# Patient Record
Sex: Male | Born: 2000 | Race: Black or African American | Hispanic: No | Marital: Single | Smoking: Current every day smoker
Health system: Southern US, Community
[De-identification: ages and names within clinical notes are randomized; demographics above are authoritative.]

## PROBLEM LIST (undated history)

## (undated) HISTORY — PX: KNEE ARTHROSCOPY: SHX127

## (undated) HISTORY — PX: KNEE SURGERY: SHX244

---
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
## 2019-12-11 ENCOUNTER — Encounter (HOSPITAL_BASED_OUTPATIENT_CLINIC_OR_DEPARTMENT_OTHER): Payer: Self-pay

## 2019-12-11 ENCOUNTER — Emergency Department (HOSPITAL_BASED_OUTPATIENT_CLINIC_OR_DEPARTMENT_OTHER)
Admission: EM | Admit: 2019-12-11 | Discharge: 2019-12-11 | Disposition: A | Discharge status: Home / Self Care | Payer: Medicaid Other | Attending: Emergency Medicine | Admitting: Emergency Medicine

## 2019-12-11 ENCOUNTER — Emergency Department (HOSPITAL_BASED_OUTPATIENT_CLINIC_OR_DEPARTMENT_OTHER): Payer: Medicaid Other

## 2019-12-11 ENCOUNTER — Other Ambulatory Visit: Payer: Self-pay

## 2019-12-11 DIAGNOSIS — F17210 Nicotine dependence, cigarettes, uncomplicated: Secondary | ICD-10-CM | POA: Insufficient documentation

## 2019-12-11 DIAGNOSIS — Z79899 Other long term (current) drug therapy: Secondary | ICD-10-CM | POA: Insufficient documentation

## 2019-12-11 DIAGNOSIS — R0789 Other chest pain: Secondary | ICD-10-CM

## 2019-12-11 MED ORDER — KETOROLAC TROMETHAMINE 15 MG/ML IJ SOLN
15.0000 mg | Freq: Once | INTRAMUSCULAR | Status: AC
  Administered 2019-12-11: 15 mg via INTRAVENOUS
  Filled 2019-12-11: qty 1

## 2019-12-11 NOTE — Discharge Instructions (Addendum)
Take 800 mg of Motrin every 8 hours for the next 5 days and then as needed.  Take 1000 mg of Tylenol 4 times a day for the next 5 days and then as needed.

## 2019-12-11 NOTE — ED Triage Notes (Addendum)
Pt c/o central CP x today-NAD-steady gait-pt also c/o rash "to my inner thigh" x 2 weeks

## 2019-12-11 NOTE — ED Provider Notes (Signed)
[facility] EMERGENCY DEPARTMENT Provider Note   CSN: [id number] Arrival date & time: 12/11/19  1946     History Chief Complaint  Patient presents with  . Chest Pain    Ernest Lam is a 18 y.o. male.  The history is provided by the patient.  Chest Pain Pain location:  L chest Pain quality: aching   Pain radiates to:  Does not radiate Pain severity:  Mild Onset quality:  Gradual Timing:  Intermittent Progression:  Waxing and waning Context: breathing, movement and raising an arm   Relieved by:  Nothing Worsened by:  Movement Associated symptoms: no abdominal pain, no back pain, no cough, no fever, no palpitations, no shortness of breath and no vomiting   Risk factors: no coronary artery disease, no diabetes mellitus, no high cholesterol, no hypertension and no prior DVT/PE        History reviewed. No pertinent past medical history.  There are no problems to display for this patient.   Past Surgical History:  Procedure Laterality Date  . KNEE ARTHROSCOPY    . KNEE SURGERY     removed a benign tumor/ 2014       No family history on file.  Social History   Tobacco Use  . Smoking status: Current Every Day Smoker    Types: Cigarettes  . Smokeless tobacco: Never Used  Substance Use Topics  . Alcohol use: No  . Drug use: Yes    Types: Marijuana    Home Medications Prior to Admission medications   Medication Sig Start Date End Date Taking? Authorizing Provider  famotidine (PEPCID) 20 MG tablet Take 1 tablet (20 mg total) by mouth 2 (two) times daily. 10/05/16   Nelva NayBeaton, Robert, MD  ondansetron (ZOFRAN ODT) 4 MG disintegrating tablet Take 1 tablet (4 mg total) by mouth every 8 (eight) hours as needed for nausea or vomiting. 07/20/17   McDonald, Mia A, PA-C    Allergies    Patient has no known allergies.  Review of Systems   Review of Systems  Constitutional: Negative for chills and fever.  HENT: Negative for ear pain and sore throat.   Eyes:  Negative for pain and visual disturbance.  Respiratory: Negative for cough, chest tightness and shortness of breath.   Cardiovascular: Positive for chest pain. Negative for palpitations.  Gastrointestinal: Negative for abdominal pain and vomiting.  Genitourinary: Negative for dysuria and hematuria.  Musculoskeletal: Negative for arthralgias and back pain.  Skin: Negative for color change and rash.  Neurological: Negative for seizures and syncope.  All other systems reviewed and are negative.   Physical Exam Updated Vital Signs BP 130/63 (BP Location: Left Arm)   Pulse (!) 54   Temp 98.6 F (37 C) (Oral)   Resp 18   Ht 6\' 1"  (1.854 m)   Wt 117 kg   SpO2 95%   BMI 34.04 kg/m   Physical Exam Vitals and nursing note reviewed.  Constitutional:      General: He is not in acute distress.    Appearance: He is well-developed. He is not ill-appearing.  HENT:     Head: Normocephalic and atraumatic.  Eyes:     Extraocular Movements: Extraocular movements intact.     Conjunctiva/sclera: Conjunctivae normal.     Pupils: Pupils are equal, round, and reactive to light.  Cardiovascular:     Rate and Rhythm: Normal rate and regular rhythm.     Pulses:          Radial pulses  are 2+ on the right side and 2+ on the left side.     Heart sounds: Normal heart sounds. No murmur.  Pulmonary:     Effort: Pulmonary effort is normal. No respiratory distress.     Breath sounds: Normal breath sounds. No decreased breath sounds or wheezing.  Abdominal:     Palpations: Abdomen is soft.     Tenderness: There is no abdominal tenderness.  Musculoskeletal:     Cervical back: Normal range of motion and neck supple.  Skin:    General: Skin is warm and dry.     Capillary Refill: Capillary refill takes less than 2 seconds.     Findings: Rash (dry skin to right inner thigh) present.  Neurological:     General: No focal deficit present.     Mental Status: He is alert.  Psychiatric:        Mood and  Affect: Mood normal.     ED Results / Procedures / Treatments   Labs (all labs ordered are listed, but only abnormal results are displayed) Labs Reviewed - No data to display  EKG EKG Interpretation  Date/Time:  Wednesday December 11 2019 19:53:39 EST Ventricular Rate:  51 PR Interval:  166 QRS Duration: 100 QT Interval:  430 QTC Calculation: 396 R Axis:   84 Text Interpretation: Sinus bradycardia Early repolarization Otherwise normal ECG No significant change since last tracing Confirmed by Lennice Sites [phone]) on 12/11/2019 8:00:26 PM   Radiology DG Chest 2 View  Result Date: 12/11/2019 CLINICAL DATA:  Chest pain EXAM: CHEST - 2 VIEW COMPARISON:  Radiograph 10/05/2016 FINDINGS: No consolidation, features of edema, pneumothorax, or effusion. Pulmonary vascularity is normally distributed. The cardiomediastinal contours are unremarkable. No acute osseous or soft tissue abnormality. IMPRESSION: No acute cardiopulmonary abnormality. Electronically Signed   By: Lovena Le M.D.   On: 12/11/2019 20:42    Procedures Procedures (including critical care time)  Medications Ordered in ED Medications  ketorolac (TORADOL) 15 MG/ML injection 15 mg (15 mg Intravenous Given 12/11/19 2019)    ED Course  I have reviewed the triage vital signs and the nursing notes.  Pertinent labs & imaging results that were available during my care of the patient were reviewed by me and considered in my medical decision making (see chart for details).    MDM Rules/Calculators/A&P                      Ernest Lam is a 18 year old male with no significant medical history presents to the ED with left-sided chest pain.  Patient with normal vitals.  No fever.  Also has a rash to his right inner thigh for the last several weeks that looks contact related rash.  No need for any active treatment.  Chest pain appears reproducible.  Patient has sinus bradycardia with early repole.  No infectious symptoms.   Noncardiac sounding chest pain.  Patient does repetitive lifting at work.  Overall likely musculoskeletal type chest pain.  No cardiac risk factors.  PERC negative and doubt PE.  Chest x-ray showed no signs of pneumonia, no pneumothorax, no pleural effusion.  Patient given Toradol shot.  Recommend continued use of Tylenol, Motrin for muscle related pain.  Discharged from ED in good condition.  Given return precautions.  This chart was dictated using voice recognition software.  Despite best efforts to proofread,  errors can occur which can change the documentation meaning.   Final Clinical Impression(s) / ED Diagnoses Final diagnoses:  Chest wall pain    Rx / DC Orders ED Discharge Orders    None       Virgina Norfolk, DO 12/11/19 2054

## 2020-02-25 ENCOUNTER — Emergency Department (HOSPITAL_BASED_OUTPATIENT_CLINIC_OR_DEPARTMENT_OTHER)
Admission: EM | Admit: 2020-02-25 | Discharge: 2020-02-25 | Disposition: A | Discharge status: Home / Self Care | Payer: Medicaid Other | Attending: Emergency Medicine | Admitting: Emergency Medicine

## 2020-02-25 ENCOUNTER — Other Ambulatory Visit: Payer: Self-pay

## 2020-02-25 ENCOUNTER — Encounter (HOSPITAL_BASED_OUTPATIENT_CLINIC_OR_DEPARTMENT_OTHER): Payer: Self-pay | Admitting: *Deleted

## 2020-02-25 DIAGNOSIS — F17210 Nicotine dependence, cigarettes, uncomplicated: Secondary | ICD-10-CM | POA: Insufficient documentation

## 2020-02-25 DIAGNOSIS — Z202 Contact with and (suspected) exposure to infections with a predominantly sexual mode of transmission: Secondary | ICD-10-CM | POA: Insufficient documentation

## 2020-02-25 DIAGNOSIS — R300 Dysuria: Secondary | ICD-10-CM | POA: Insufficient documentation

## 2020-02-25 LAB — HIV ANTIBODY (ROUTINE TESTING W REFLEX): HIV Screen 4th Generation wRfx: NONREACTIVE

## 2020-02-25 LAB — URINALYSIS, ROUTINE W REFLEX MICROSCOPIC
Bilirubin Urine: NEGATIVE
Glucose, UA: NEGATIVE mg/dL
Hgb urine dipstick: NEGATIVE
Ketones, ur: NEGATIVE mg/dL
Leukocytes,Ua: NEGATIVE
Nitrite: NEGATIVE
Protein, ur: NEGATIVE mg/dL
Specific Gravity, Urine: >1.03 — ABNORMAL HIGH (ref 1.005–1.030)
pH: 6 (ref 5.0–8.0)

## 2020-02-25 LAB — RPR: RPR Ser Ql: NONREACTIVE

## 2020-02-25 MED ORDER — AZITHROMYCIN 250 MG PO TABS
1000.0000 mg | ORAL_TABLET | Freq: Once | ORAL | Status: AC
  Administered 2020-02-25: 1000 mg via ORAL
  Filled 2020-02-25: qty 4

## 2020-02-25 MED ORDER — CEFTRIAXONE SODIUM 500 MG IJ SOLR
500.0000 mg | Freq: Once | INTRAMUSCULAR | Status: AC
  Administered 2020-02-25: 500 mg via INTRAMUSCULAR
  Filled 2020-02-25: qty 500

## 2020-02-25 NOTE — ED Provider Notes (Signed)
**Note Ernest Lam** [facility] EMERGENCY DEPARTMENT Provider Note   CSN: [id number] Arrival date & time: 02/25/20  0010     History Chief Complaint  Patient presents with  . SEXUALLY TRANSMITTED DISEASE    Ernest Lam is a 19 y.o. male.  The history is provided by the patient.  patient presents with STD testing.  He reports he was recently exposed to chlamydia.  He does not always uses condoms.  He has one sexual partner.  He reports mild dysuria, but no other acute complaints.  His course is stable.        PMH-none Past Surgical History:  Procedure Laterality Date  . KNEE ARTHROSCOPY    . KNEE SURGERY     removed a benign tumor/ 2014       No family history on file.  Social History   Tobacco Use  . Smoking status: Current Every Day Smoker    Types: Cigarettes  . Smokeless tobacco: Never Used  Substance Use Topics  . Alcohol use: No  . Drug use: Yes    Types: Marijuana    Home Medications Prior to Admission medications   Medication Sig Start Date End Date Taking? Authorizing Provider  famotidine (PEPCID) 20 MG tablet Take 1 tablet (20 mg total) by mouth 2 (two) times daily. 10/05/16   Leonard Schwartz, MD  ondansetron (ZOFRAN ODT) 4 MG disintegrating tablet Take 1 tablet (4 mg total) by mouth every 8 (eight) hours as needed for nausea or vomiting. 07/20/17   McDonald, Mia A, PA-C    Allergies    Patient has no known allergies.  Review of Systems   Review of Systems  Constitutional: Negative for fever.  Genitourinary: Positive for dysuria. Negative for discharge and genital sores.    Physical Exam Updated Vital Signs BP 135/81   Pulse 62   Temp 98.1 F (36.7 C) (Oral)   Resp 19   Ht 1.905 m (6\' 3" )   Wt 118.8 kg   SpO2 99%   BMI 32.75 kg/m   Physical Exam CONSTITUTIONAL: Well developed/well nourished HEAD: Normocephalic/atraumatic EYES: EOMI ENMT: Mucous membranes moist NECK: supple no meningeal signs LUNGS:  no apparent distress ABDOMEN: soft    NEURO: Pt is awake/alert/appropriate, moves all extremitiesx4.  No facial droop.   EXTREMITIES:  full ROM SKIN: warm, color normal PSYCH: no abnormalities of mood noted, alert and oriented to situation  ED Results / Procedures / Treatments   Labs (all labs ordered are listed, but only abnormal results are displayed) Labs Reviewed  URINALYSIS, ROUTINE W REFLEX MICROSCOPIC - Abnormal; Notable for the following components:      Result Value   Specific Gravity, Urine >1.030 (*)    All other components within normal limits  RPR  HIV ANTIBODY (ROUTINE TESTING W REFLEX)  GC/CHLAMYDIA PROBE AMP ([place]) NOT AT [facility]    EKG None  Radiology No results found.  Procedures Procedures   Medications Ordered in ED Medications  cefTRIAXone (ROCEPHIN) injection 500 mg (500 mg Intramuscular Given 02/25/20 0246)  azithromycin (ZITHROMAX) tablet 1,000 mg (1,000 mg Oral Given 02/25/20 0245)    ED Course  I have reviewed the triage vital signs and the nursing notes.  Pertinent labs   results that were available during my care of the patient were reviewed by me and considered in my medical decision making (see chart for details).    MDM Rules/Calculators/A&P  Plan to empirically treat for STDs.  We discussed safe sex precautions Final Clinical Impression(s) / ED Diagnoses Final diagnoses:  Exposure to chlamydia    Rx / DC Orders ED Discharge Orders    None       Zadie Rhine, MD 02/25/20 [phone]

## 2020-02-25 NOTE — ED Triage Notes (Signed)
Pt. Reports he was exposed and his exgirlfriend told him she had one yesterday.

## 2020-02-26 LAB — GC/CHLAMYDIA PROBE AMP (~~LOC~~) NOT AT ARMC
Chlamydia: NEGATIVE
Neisseria Gonorrhea: NEGATIVE

## 2020-05-11 ENCOUNTER — Encounter (HOSPITAL_BASED_OUTPATIENT_CLINIC_OR_DEPARTMENT_OTHER): Payer: Self-pay

## 2020-05-11 ENCOUNTER — Other Ambulatory Visit: Payer: Self-pay

## 2020-05-11 ENCOUNTER — Emergency Department (HOSPITAL_BASED_OUTPATIENT_CLINIC_OR_DEPARTMENT_OTHER)
Admission: EM | Admit: 2020-05-11 | Discharge: 2020-05-11 | Disposition: A | Discharge status: Home / Self Care | Payer: Medicaid Other | Attending: Emergency Medicine | Admitting: Emergency Medicine

## 2020-05-11 DIAGNOSIS — L03818 Cellulitis of other sites: Secondary | ICD-10-CM | POA: Insufficient documentation

## 2020-05-11 DIAGNOSIS — L03211 Cellulitis of face: Secondary | ICD-10-CM

## 2020-05-11 DIAGNOSIS — F17210 Nicotine dependence, cigarettes, uncomplicated: Secondary | ICD-10-CM | POA: Insufficient documentation

## 2020-05-11 MED ORDER — LIDOCAINE-EPINEPHRINE-TETRACAINE (LET) TOPICAL GEL
3.0000 mL | Freq: Once | TOPICAL | Status: AC
Start: 2020-05-11 — End: 2020-05-11
  Administered 2020-05-11: 3 mL via TOPICAL

## 2020-05-11 MED ORDER — LIDOCAINE-EPINEPHRINE-TETRACAINE (LET) TOPICAL GEL: 3.0000 mL | Freq: Once | TOPICAL | Status: DC

## 2020-05-11 MED ORDER — KETOROLAC TROMETHAMINE 15 MG/ML IJ SOLN
30.0000 mg | Freq: Once | INTRAMUSCULAR | Status: AC
  Administered 2020-05-11: 30 mg via INTRAMUSCULAR
  Filled 2020-05-11: qty 2

## 2020-05-11 MED ORDER — LIDOCAINE-PRILOCAINE 2.5-2.5 % EX CREA
TOPICAL_CREAM | Freq: Once | CUTANEOUS | Status: DC
  Filled 2020-05-11: qty 5

## 2020-05-11 MED ORDER — DOXYCYCLINE HYCLATE 100 MG PO CAPS: 100.0000 mg | ORAL_CAPSULE | Freq: Two times a day (BID) | ORAL | 0 refills | Status: AC

## 2020-05-11 MED ORDER — LIDOCAINE-EPINEPHRINE-TETRACAINE (LET) TOPICAL GEL
TOPICAL | Status: AC
  Filled 2020-05-11: qty 3

## 2020-05-11 NOTE — ED Triage Notes (Signed)
Pt arrives with swelling to left upper lip. Pt states he had a bump and attempted to pop it, states upon waking area was swollen.

## 2020-05-11 NOTE — ED Provider Notes (Signed)
[facility] EMERGENCY DEPARTMENT Provider Note   CSN: [id number] Arrival date & time: 05/11/20  1223     History Chief Complaint  Patient presents with  . Facial Swelling    Ernest Lam is a 19 y.o. male.  HPI 19 year old African-American male with no pertinent past medical history presents the ER for evaluation of left upper lip swelling.  Patient reports that yesterday he noticed a pimple to his left side of his face.  Patient attempted to pop it last night and got a small amount of drainage from the area.  Patient states he woke up this morning with significant swelling to his left face and upper lip.  Tender to palpation.  No further drainage noted.  Patient denies any fevers or chills.  No difficulties breathing or swallowing.  No new medications.  No history of MRSA infection.    History reviewed. No pertinent past medical history.  There are no problems to display for this patient.   Past Surgical History:  Procedure Laterality Date  . KNEE ARTHROSCOPY    . KNEE SURGERY     removed a benign tumor/ 2014       No family history on file.  Social History   Tobacco Use  . Smoking status: Current Every Day Smoker    Types: Cigarettes  . Smokeless tobacco: Never Used  Substance Use Topics  . Alcohol use: No  . Drug use: Yes    Types: Marijuana    Home Medications Prior to Admission medications   Medication Sig Start Date End Date Taking? Authorizing Provider  doxycycline (VIBRAMYCIN) 100 MG capsule Take 1 capsule (100 mg total) by mouth 2 (two) times daily. 05/11/20   Doristine Devoid, PA-C  famotidine (PEPCID) 20 MG tablet Take 1 tablet (20 mg total) by mouth 2 (two) times daily. 10/05/16   Leonard Schwartz, MD  ondansetron (ZOFRAN ODT) 4 MG disintegrating tablet Take 1 tablet (4 mg total) by mouth every 8 (eight) hours as needed for nausea or vomiting. 07/20/17   McDonald, Mia A, PA-C    Allergies    Patient has no known allergies.  Review of  Systems   Review of Systems  Constitutional: Negative for chills and fever.  HENT: Positive for facial swelling. Negative for congestion.   Eyes: Negative for discharge.  Respiratory: Negative for cough.   Gastrointestinal: Negative for vomiting.  Skin: Positive for wound.  Psychiatric/Behavioral: Negative for confusion.    Physical Exam Updated Vital Signs BP 138/86 (BP Location: Right Arm)   Pulse 62   Temp 98.7 F (37.1 C) (Oral)   Resp 18   Ht 6\' 3"  (1.905 m)   Wt 113.4 kg   SpO2 100%   BMI 31.25 kg/m   Physical Exam Vitals and nursing note reviewed.  Constitutional:      General: He is not in acute distress.    Appearance: He is well-developed.  HENT:     Head: Normocephalic and atraumatic.     Comments: Patient with pimple just above the upper lip with associated facial swelling.  No erythema.  No significant drainage from the site.  Tender to palpation.    Mouth/Throat:     Mouth: Mucous membranes are moist.     Pharynx: Oropharynx is clear.  Eyes:     General: No scleral icterus.       Right eye: No discharge.        Left eye: No discharge.  Pulmonary:     Effort:  No respiratory distress.  Musculoskeletal:        General: Normal range of motion.     Cervical back: Normal range of motion.  Skin:    Coloration: Skin is not pale.  Neurological:     Mental Status: He is alert.  Psychiatric:        Behavior: Behavior normal.        Thought Content: Thought content normal.        Judgment: Judgment normal.     ED Results / Procedures / Treatments   Labs (all labs ordered are listed, but only abnormal results are displayed) Labs Reviewed - No data to display  EKG None  Radiology No results found.  Procedures .Marland KitchenIncision and Drainage  Date/Time: 05/11/2020 2:40 PM Performed by: Rise Mu, PA-C Authorized by: Rise Mu, PA-C   Consent:    Consent obtained:  Verbal   Consent given by:  Patient   Risks discussed:  Bleeding,  damage to other organs, infection and incomplete drainage Location:    Type:  Abscess   Size:  2   Location:  Head   Head location:  Face Pre-procedure details:    Skin preparation:  Antiseptic wash Procedure details:    Incision types:  Stab incision   Scalpel size: 18 g needle.   Drainage:  Bloody and purulent   Drainage amount:  Scant   Wound treatment:  Wound left open   Packing materials:  None   (including critical care time)  Medications Ordered in ED Medications  lidocaine-EPINEPHrine-tetracaine (LET) topical gel (3 mLs Topical Given 05/11/20 1341)  ketorolac (TORADOL) 15 MG/ML injection 30 mg (30 mg Intramuscular Given 05/11/20 1433)    ED Course  I have reviewed the triage vital signs and the nursing notes.  Pertinent labs & imaging results that were available during my care of the patient were reviewed by me and considered in my medical decision making (see chart for details).    MDM Rules/Calculators/A&P                      Patient with skin abscess amenable to incision and drainage.  Abscess was not large enough to warrant packing or drain,  wound recheck in 2 days. Encouraged home warm soaks and flushing.  Mild signs of cellulitis is surrounding skin.  Will d/c to home.  No antibiotic therapy is indicated.   Final Clinical Impression(s) / ED Diagnoses Final diagnoses:  Cellulitis of face    Rx / DC Orders ED Discharge Orders         Ordered    doxycycline (VIBRAMYCIN) 100 MG capsule  2 times daily     05/11/20 1421           Wallace Keller 05/11/20 1442    Raeford Razor, MD 05/11/20 1447

## 2020-05-11 NOTE — Discharge Instructions (Addendum)
Take antibiotics for infection.  Warm and cold compress to the area.  Anti-inflammatory such as Motrin, Advil or Aleve for pain and swelling.  Please follow-up in 2 to 3 days if symptoms not improving with antibiotics return sooner if your symptoms worsen.

## 2020-05-11 NOTE — ED Notes (Signed)
Pimple to upper lip w increased swelling

## 2020-05-13 ENCOUNTER — Encounter (HOSPITAL_BASED_OUTPATIENT_CLINIC_OR_DEPARTMENT_OTHER): Payer: Self-pay | Admitting: *Deleted

## 2020-05-13 ENCOUNTER — Other Ambulatory Visit: Payer: Self-pay

## 2020-05-13 ENCOUNTER — Emergency Department (HOSPITAL_BASED_OUTPATIENT_CLINIC_OR_DEPARTMENT_OTHER)
Admission: EM | Admit: 2020-05-13 | Discharge: 2020-05-13 | Disposition: A | Discharge status: Home / Self Care | Payer: Medicaid Other | Attending: Emergency Medicine | Admitting: Emergency Medicine

## 2020-05-13 DIAGNOSIS — L0291 Cutaneous abscess, unspecified: Secondary | ICD-10-CM

## 2020-05-13 DIAGNOSIS — F17210 Nicotine dependence, cigarettes, uncomplicated: Secondary | ICD-10-CM | POA: Insufficient documentation

## 2020-05-13 DIAGNOSIS — L0201 Cutaneous abscess of face: Secondary | ICD-10-CM | POA: Insufficient documentation

## 2020-05-13 DIAGNOSIS — Z79899 Other long term (current) drug therapy: Secondary | ICD-10-CM | POA: Insufficient documentation

## 2020-05-13 MED ORDER — SULFAMETHOXAZOLE-TRIMETHOPRIM 800-160 MG PO TABS
1.0000 | ORAL_TABLET | Freq: Two times a day (BID) | ORAL | 0 refills | Status: DC
Start: 2020-05-13 — End: 2020-05-13

## 2020-05-13 MED ORDER — SULFAMETHOXAZOLE-TRIMETHOPRIM 800-160 MG PO TABS
2.0000 | ORAL_TABLET | Freq: Once | ORAL | Status: AC
  Administered 2020-05-13: 2 via ORAL
  Filled 2020-05-13: qty 2

## 2020-05-13 MED ORDER — SULFAMETHOXAZOLE-TRIMETHOPRIM 800-160 MG PO TABS: 1.0000 | ORAL_TABLET | Freq: Two times a day (BID) | ORAL | 0 refills | Status: AC

## 2020-05-13 NOTE — ED Provider Notes (Signed)
[facility] EMERGENCY DEPARTMENT Provider Note   CSN: [id number] Arrival date & time: 05/13/20  0009     History Chief Complaint  Patient presents with  . Abscess    Ernest Lam is a 19 y.o. male.  Patient presents to the emergency department for evaluation of pain and swelling of the left.  He was seen in the ED yesterday, did not fill prescription for antibiotic.  Patient reports that he has been squeezing the area and has gotten some thick pus out.  Lip is more swollen today than it has been.        History reviewed. No pertinent past medical history.  There are no problems to display for this patient.   Past Surgical History:  Procedure Laterality Date  . KNEE ARTHROSCOPY    . KNEE SURGERY     removed a benign tumor/ 2014       History reviewed. No pertinent family history.  Social History   Tobacco Use  . Smoking status: Current Every Day Smoker    Types: Cigarettes  . Smokeless tobacco: Never Used  Substance Use Topics  . Alcohol use: No  . Drug use: Yes    Types: Marijuana    Home Medications Prior to Admission medications   Medication Sig Start Date End Date Taking? Authorizing Provider  doxycycline (VIBRAMYCIN) 100 MG capsule Take 1 capsule (100 mg total) by mouth 2 (two) times daily. 05/11/20   Rise Mu, PA-C  famotidine (PEPCID) 20 MG tablet Take 1 tablet (20 mg total) by mouth 2 (two) times daily. 10/05/16   Nelva Nay, MD  ondansetron (ZOFRAN ODT) 4 MG disintegrating tablet Take 1 tablet (4 mg total) by mouth every 8 (eight) hours as needed for nausea or vomiting. 07/20/17   McDonald, Mia A, PA-C  sulfamethoxazole-trimethoprim (BACTRIM DS) 800-160 MG tablet Take 1 tablet by mouth 2 (two) times daily. 05/13/20   Gilda Crease, MD    Allergies    Patient has no known allergies.  Review of Systems   Review of Systems  Constitutional: Negative for fever.  Skin: Positive for wound.    Physical Exam Updated  Vital Signs BP 130/71 (BP Location: Left Arm)   Pulse 70   Temp 98.7 F (37.1 C) (Oral)   Resp 16   Ht 6\' 3"  (1.905 m)   Wt 113.4 kg   SpO2 97%   BMI 31.25 kg/m   Physical Exam Vitals and nursing note reviewed.  Constitutional:      General: He is not in acute distress.    Appearance: Normal appearance. He is well-developed.  HENT:     Head: Normocephalic and atraumatic.     Comments: Ulcerated area with surrounding induration left side of upper lip.  No fluctuance.    Right Ear: Hearing normal.     Left Ear: Hearing normal.     Nose: Nose normal.  Eyes:     Conjunctiva/sclera: Conjunctivae normal.     Pupils: Pupils are equal, round, and reactive to light.  Cardiovascular:     Rate and Rhythm: Regular rhythm.     Heart sounds: S1 normal and S2 normal. No murmur. No friction rub. No gallop.   Pulmonary:     Effort: Pulmonary effort is normal. No respiratory distress.     Breath sounds: Normal breath sounds.  Chest:     Chest wall: No tenderness.  Abdominal:     General: Bowel sounds are normal.     Palpations: Abdomen  is soft.     Tenderness: There is no abdominal tenderness. There is no guarding or rebound. Negative signs include Murphy's sign and McBurney's sign.     Hernia: No hernia is present.  Musculoskeletal:        General: Normal range of motion.     Cervical back: Normal range of motion and neck supple.  Skin:    General: Skin is warm and dry.     Findings: No rash.  Neurological:     Mental Status: He is alert and oriented to person, place, and time.     GCS: GCS eye subscore is 4. GCS verbal subscore is 5. GCS motor subscore is 6.     Cranial Nerves: No cranial nerve deficit.     Sensory: No sensory deficit.     Coordination: Coordination normal.  Psychiatric:        Speech: Speech normal.        Behavior: Behavior normal.        Thought Content: Thought content normal.     ED Results / Procedures / Treatments   Labs (all labs ordered are  listed, but only abnormal results are displayed) Labs Reviewed - No data to display  EKG None  Radiology No results found.  Procedures Procedures (including critical care time)  Medications Ordered in ED Medications  sulfamethoxazole-trimethoprim (BACTRIM DS) 800-160 MG per tablet 2 tablet (has no administration in time range)    ED Course  I have reviewed the triage vital signs and the nursing notes.  Pertinent labs & imaging results that were available during my care of the patient were reviewed by me and considered in my medical decision making (see chart for details).    MDM Rules/Calculators/A&P                      Patient with continued lesion on the lip.  He has not started the prescribed antibiotics.  I do not feel any fluctuance in the area.  There is an open ulcerated area in the center of the swollen portion from him squeezing on it.  There is some necrotic tissue within the ulcerated area but no pus expresses.  Try Bactrim as this is most likely staph and will be cheaper than the doxycycline.  Final Clinical Impression(s) / ED Diagnoses Final diagnoses:  Abscess    Rx / DC Orders ED Discharge Orders         Ordered    sulfamethoxazole-trimethoprim (BACTRIM DS) 800-160 MG tablet  2 times daily     05/13/20 0208           Orpah Greek, MD 05/13/20 [phone]

## 2020-05-13 NOTE — ED Triage Notes (Addendum)
Abscess to face x 4 days , seen here yesterday for same, pt did not start doxy, pt requesting work note

## 2020-10-31 ENCOUNTER — Emergency Department (HOSPITAL_BASED_OUTPATIENT_CLINIC_OR_DEPARTMENT_OTHER)
Admission: EM | Admit: 2020-10-31 | Discharge: 2020-10-31 | Disposition: A | Discharge status: Home / Self Care | Payer: Medicaid Other | Attending: Emergency Medicine | Admitting: Emergency Medicine

## 2020-10-31 ENCOUNTER — Encounter (HOSPITAL_BASED_OUTPATIENT_CLINIC_OR_DEPARTMENT_OTHER): Payer: Self-pay

## 2020-10-31 ENCOUNTER — Other Ambulatory Visit: Payer: Self-pay

## 2020-10-31 DIAGNOSIS — Z202 Contact with and (suspected) exposure to infections with a predominantly sexual mode of transmission: Secondary | ICD-10-CM | POA: Insufficient documentation

## 2020-10-31 DIAGNOSIS — B029 Zoster without complications: Secondary | ICD-10-CM

## 2020-10-31 DIAGNOSIS — F17210 Nicotine dependence, cigarettes, uncomplicated: Secondary | ICD-10-CM | POA: Insufficient documentation

## 2020-10-31 DIAGNOSIS — F17290 Nicotine dependence, other tobacco product, uncomplicated: Secondary | ICD-10-CM | POA: Insufficient documentation

## 2020-10-31 MED ORDER — VALACYCLOVIR HCL 1 G PO TABS: 1000.0000 mg | ORAL_TABLET | Freq: Three times a day (TID) | ORAL | 0 refills | Status: AC

## 2020-10-31 NOTE — ED Provider Notes (Signed)
[facility] EMERGENCY DEPARTMENT Provider Note   CSN: [id number] Arrival date & time: 10/31/20  1258     History Chief Complaint  Patient presents with  . Exposure to STD    Ernest Lam is a 19 y.o. male.  19 year old male presents with complaint of rash to his face with request for STD testing.  Patient states that he had his haircut 1 week ago, the next day he noticed itching on the right side of his scalp with lesions breaking out on his forehead and down the right side of his nose on Tuesday (4 days ago).  Patient dates the area feels irritated, he has been applying Vaseline to the area.  Denies eye pain or visual disturbance.  Also states he was told by male partner that she recently tested positive for chlamydia, states that he had intercourse with this person did not use a condom.  Patient denies any urethral discharge or other concerns.        History reviewed. No pertinent past medical history.  There are no problems to display for this patient.   Past Surgical History:  Procedure Laterality Date  . KNEE ARTHROSCOPY    . KNEE SURGERY     removed a benign tumor/ 2014       No family history on file.  Social History   Tobacco Use  . Smoking status: Current Every Day Smoker    Types: Cigarettes, Cigars  . Smokeless tobacco: Never Used  Vaping Use  . Vaping Use: Every day  Substance Use Topics  . Alcohol use: No  . Drug use: Yes    Types: Marijuana    Home Medications Prior to Admission medications   Medication Sig Start Date End Date Taking? Authorizing Provider  doxycycline (VIBRAMYCIN) 100 MG capsule Take 1 capsule (100 mg total) by mouth 2 (two) times daily. 05/11/20   Rise Mu, PA-C  famotidine (PEPCID) 20 MG tablet Take 1 tablet (20 mg total) by mouth 2 (two) times daily. 10/05/16   Nelva Nay, MD  ondansetron (ZOFRAN ODT) 4 MG disintegrating tablet Take 1 tablet (4 mg total) by mouth every 8 (eight) hours as needed for  nausea or vomiting. 07/20/17   McDonald, Mia A, PA-C  sulfamethoxazole-trimethoprim (BACTRIM DS) 800-160 MG tablet Take 1 tablet by mouth 2 (two) times daily. 05/13/20   Gilda Crease, MD  valACYclovir (VALTREX) 1000 MG tablet Take 1 tablet (1,000 mg total) by mouth 3 (three) times daily. 10/31/20   Jeannie Fend, PA-C    Allergies    Patient has no known allergies.  Review of Systems   Review of Systems  Constitutional: Negative for fever.  Eyes: Negative for pain and visual disturbance.  Genitourinary: Negative for discharge, dysuria, genital sores, penile pain, penile swelling, scrotal swelling and testicular pain.  Musculoskeletal: Negative for arthralgias and myalgias.  Skin: Positive for rash. Negative for wound.  Allergic/Immunologic: Negative for immunocompromised state.  Hematological: Negative for adenopathy.  All other systems reviewed and are negative.   Physical Exam Updated Vital Signs BP 134/84 (BP Location: Left Arm)   Pulse 77   Temp 98.5 F (36.9 C) (Oral)   Resp 18   Ht 6\' 1"  (1.854 m)   Wt 113.4 kg   SpO2 99%   BMI 32.98 kg/m   Physical Exam Vitals and nursing note reviewed. Exam conducted with a chaperone present.  Constitutional:      General: He is not in acute distress.  Appearance: He is well-developed. He is not diaphoretic.  HENT:     Head: Normocephalic and atraumatic.  Pulmonary:     Effort: Pulmonary effort is normal.  Genitourinary:    Penis: Circumcised. No tenderness or discharge.   Skin:    General: Skin is warm and dry.     Comments: Patient's to right side of scalp near midline, extends down the line forehead to right side of nose.  No lesions noted to tip of nose. No lesions noted around the eye.  Neurological:     Mental Status: He is alert and oriented to person, place, and time.  Psychiatric:        Behavior: Behavior normal.     ED Results / Procedures / Treatments   Labs (all labs ordered are listed, but only  abnormal results are displayed) Labs Reviewed  VIRUS CULTURE  GC/CHLAMYDIA PROBE AMP ([place]) NOT AT [facility]    EKG None  Radiology No results found.  Procedures Procedures (including critical care time)  Medications Ordered in ED Medications - No data to display  ED Course  I have reviewed the triage vital signs and the nursing notes.  Pertinent labs & imaging results that were available during my care of the patient were reviewed by me and considered in my medical decision making (see chart for details).  Clinical Course as of Oct 31 1529  Sat Oct 31, 2020  [id number] 19 year old male with lesions to face x4 days.  Also with request for STD test. On exam, found to have lesions to right side of scalp extending on right side of nose, question presence of vesicles however difficult to assess as patient has been applying Vaseline to lesions. Lesions were swabbed with concern for shingles, advised to start Valtrex today.  Recheck for worsening or concerning symptoms. Patient to follow-up on his gonorrhea and chlamydia test and return to health department for any necessary treatment.  Also recommend follow-up with health department should he desire to be tested for syphilis or HIV.   [LM]    Clinical Course User Index [LM] Alden Hipp   MDM Rules/Calculators/A&P                          Final Clinical Impression(s) / ED Diagnoses Final diagnoses:  STD exposure  Herpes zoster without complication    Rx / DC Orders ED Discharge Orders         Ordered    valACYclovir (VALTREX) 1000 MG tablet  3 times daily        10/31/20 1407           Alden Hipp 10/31/20 1530    Jacalyn Lefevre, MD 11/01/20 [phone]

## 2020-10-31 NOTE — ED Triage Notes (Signed)
Pt. Reports being exposed to someone who tested positive for chlamydia. Pt also c/o bumps on face and scalp.

## 2020-10-31 NOTE — Discharge Instructions (Addendum)
Call in 3 days to follow-up with your test results or check your MyChart account. Valtrex prescribed today is for shingles on your face.  It is important to start this medication today and complete the full course.  Recheck for worsening or concerning symptoms.  If any of your STD tests are positive, you may go to the health department for free treatment.

## 2020-11-02 LAB — GC/CHLAMYDIA PROBE AMP (~~LOC~~) NOT AT ARMC
Chlamydia: NEGATIVE
Comment: NEGATIVE
Comment: NORMAL
Neisseria Gonorrhea: NEGATIVE

## 2020-11-10 LAB — VIRUS CULTURE

## 2021-04-12 ENCOUNTER — Emergency Department (HOSPITAL_BASED_OUTPATIENT_CLINIC_OR_DEPARTMENT_OTHER)
Admission: EM | Admit: 2021-04-12 | Discharge: 2021-04-12 | Disposition: A | Discharge status: Home / Self Care | Payer: Medicaid Other | Attending: Emergency Medicine | Admitting: Emergency Medicine

## 2021-04-12 ENCOUNTER — Other Ambulatory Visit: Payer: Self-pay

## 2021-04-12 ENCOUNTER — Encounter (HOSPITAL_BASED_OUTPATIENT_CLINIC_OR_DEPARTMENT_OTHER): Payer: Self-pay

## 2021-04-12 DIAGNOSIS — B349 Viral infection, unspecified: Secondary | ICD-10-CM | POA: Insufficient documentation

## 2021-04-12 DIAGNOSIS — F17290 Nicotine dependence, other tobacco product, uncomplicated: Secondary | ICD-10-CM | POA: Insufficient documentation

## 2021-04-12 DIAGNOSIS — U071 COVID-19: Secondary | ICD-10-CM | POA: Insufficient documentation

## 2021-04-12 LAB — RESP PANEL BY RT-PCR (FLU A&B, COVID) ARPGX2
Influenza A by PCR: NEGATIVE
Influenza B by PCR: NEGATIVE
SARS Coronavirus 2 by RT PCR: POSITIVE — AB

## 2021-04-12 MED ORDER — IBUPROFEN 400 MG PO TABS
600.0000 mg | ORAL_TABLET | Freq: Once | ORAL | Status: AC
  Administered 2021-04-12: 600 mg via ORAL
  Filled 2021-04-12: qty 1

## 2021-04-12 NOTE — ED Provider Notes (Signed)
[facility] EMERGENCY DEPARTMENT Provider Note   CSN: [id number] Arrival date & time: 04/12/21  1653     History Chief Complaint  Patient presents with  . Generalized Body Aches    Ernest Lam is a 20 y.o. male with no relevant past medical history presents the ED with complaints of headache and generalized body aches.  On my examination, patient states that last night he developed chills, fevers, headache, neck and back discomfort, and generalized muscle pains.  He also states that he has mild congestion and mild cough.  He lives at home with his mother who has been asymptomatic.   He states that he went to work today, but given his body aches and overall feeling poorly, ultimately left and came here to the ED for evaluation.  He had taken some Equate pain pills earlier, which he suspects was acetaminophen.  He denies any ability to turn his head/severe neck stiffness, history of IVDA or malignancy, chronic steroids, numbness or extremity weakness, blurred vision, abdominal pain, hemoptysis, nausea or vomiting, urinary symptoms, or changes in his bowel habits.  He called his mom and they state that he received all of his childhood immunizations, suspect that included his meningococcal immunization.  He states that his sister had meningitis when she was an infant so it is not something that they would have declined.    HPI     History reviewed. No pertinent past medical history.  There are no problems to display for this patient.   Past Surgical History:  Procedure Laterality Date  . KNEE ARTHROSCOPY    . KNEE SURGERY     removed a benign tumor/ 2014       No family history on file.  Social History   Tobacco Use  . Smoking status: Current Every Day Smoker    Types: E-cigarettes  . Smokeless tobacco: Never Used  Vaping Use  . Vaping Use: Every day  Substance Use Topics  . Alcohol use: No  . Drug use: Not Currently    Types: Marijuana    Home  Medications Prior to Admission medications   Medication Sig Start Date End Date Taking? Authorizing Provider  doxycycline (VIBRAMYCIN) 100 MG capsule Take 1 capsule (100 mg total) by mouth 2 (two) times daily. 05/11/20   Rise Mu, PA-C  famotidine (PEPCID) 20 MG tablet Take 1 tablet (20 mg total) by mouth 2 (two) times daily. 10/05/16   Nelva Nay, MD  ondansetron (ZOFRAN ODT) 4 MG disintegrating tablet Take 1 tablet (4 mg total) by mouth every 8 (eight) hours as needed for nausea or vomiting. 07/20/17   McDonald, Mia A, PA-C  sulfamethoxazole-trimethoprim (BACTRIM DS) 800-160 MG tablet Take 1 tablet by mouth 2 (two) times daily. 05/13/20   Gilda Crease, MD  valACYclovir (VALTREX) 1000 MG tablet Take 1 tablet (1,000 mg total) by mouth 3 (three) times daily. 10/31/20   Jeannie Fend, PA-C    Allergies    Patient has no known allergies.  Review of Systems   Review of Systems  All other systems reviewed and are negative.   Physical Exam Updated Vital Signs BP 139/63 (BP Location: Left Arm)   Pulse 94   Temp 100.2 F (37.9 C) (Oral)   Resp 20   SpO2 100%   Physical Exam Vitals and nursing note reviewed. Exam conducted with a chaperone present.  Constitutional:      General: He is not in acute distress.    Appearance: He is  ill-appearing. He is not toxic-appearing.  HENT:     Head: Normocephalic and atraumatic.     Nose: Congestion present.     Mouth/Throat:     Pharynx: Oropharynx is clear.     Comments: No tonsillar hypertrophy, exudates, or uvular deviation. Eyes:     General: No scleral icterus.    Conjunctiva/sclera: Conjunctivae normal.  Neck:     Comments: ROM fully intact.  No meningismus.  No midline cervical tenderness to palpation.  TTP over trapezius bilaterally.  No overlying skin changes. Cardiovascular:     Rate and Rhythm: Normal rate and regular rhythm.     Pulses: Normal pulses.     Heart sounds: Normal heart sounds.  Pulmonary:      Effort: Pulmonary effort is normal. No respiratory distress.     Breath sounds: Normal breath sounds. No wheezing or rales.     Comments: No increased work of breathing.  CTA bilaterally. Abdominal:     General: Abdomen is flat. There is no distension.     Palpations: Abdomen is soft.     Tenderness: There is no abdominal tenderness.     Comments: Soft, nondistended.  No areas of tenderness.  Musculoskeletal:        General: Normal range of motion.     Cervical back: Normal range of motion. Tenderness present. No rigidity.  Skin:    General: Skin is dry.  Neurological:     General: No focal deficit present.     Mental Status: He is alert and oriented to person, place, and time.     GCS: GCS eye subscore is 4. GCS verbal subscore is 5. GCS motor subscore is 6.  Psychiatric:        Mood and Affect: Mood normal.        Behavior: Behavior normal.        Thought Content: Thought content normal.     ED Results / Procedures / Treatments   Labs (all labs ordered are listed, but only abnormal results are displayed) Labs Reviewed  RESP PANEL BY RT-PCR (FLU A&B, COVID) [id number]    EKG None  Radiology No results found.  Procedures Procedures   Medications Ordered in ED Medications  ibuprofen (ADVIL) tablet 600 mg (600 mg Oral Given 04/12/21 1858)    ED Course  I have reviewed the triage vital signs and the nursing notes.  Pertinent labs & imaging results that were available during my care of the patient were reviewed by me and considered in my medical decision making (see chart for details).    MDM Rules/Calculators/A&P                          Ernest Lam was evaluated in Emergency Department on 04/12/2021 for the symptoms described in the history of present illness. He was evaluated in the context of the global COVID-19 pandemic, which necessitated consideration that the patient might be at risk for infection with the SARS-CoV-2 virus that causes COVID-19. Institutional  protocols and algorithms that pertain to the evaluation of patients at risk for COVID-19 are in a state of rapid change based on information released by regulatory bodies including the CDC and federal and state organizations. These policies and algorithms were followed during the patient's care in the ED.  I personally reviewed patient's medical chart and all notes from triage and staff during today's encounter. I have also ordered and reviewed all labs and imaging that I felt to  be medically necessary in the evaluation of this patient's complaints and with consideration of their physical exam. If needed, translation services were available and utilized.   Patient with a 1 day history of cold symptoms and muscle aches, likely viral etiology.  Will obtain respiratory panel by PCR.  Will administer ibuprofen here in the ED for low-grade fever and headache symptoms.  While he denies any obvious sick contacts, his mild cough, congestion, generalized body aches, and low-grade fever/chills are suggestive of viral process.  There is no meningismus on my exam.  He believes that he received the meningitis immunization.  I have low suspicion for meningitis at this time.  He also adamantly denies any IVDA or other high-risk behavior otherwise concerning for osteomyelitis or epidural abscess.  There is no concerning midline spinal TTP.  Will encourage patient to maintain isolation precautions pending results of this testing.  Given brevity of illness and reassuring physical exam, do not feel as though further laboratory work-up or imaging is warranted.  Recommending over-the-counter management as needed for symptomatic relief.  He does not actively have a primary care provider, will refer to [facility].  ED return precautions discussed.  Patient voices understanding and is agreeable to the plan.  Final Clinical Impression(s) / ED Diagnoses Final diagnoses:  Viral illness    Rx /  DC Orders ED Discharge Orders    None       Lorelee New, PA-C 04/12/21 1945    Rozelle Logan, DO 04/14/21 [id number]

## 2021-04-12 NOTE — Discharge Instructions (Addendum)
You have been tested for COVID-19 and influenza.  Please maintain isolation precautions pending results of your testing.  I would like you to check your temperature regularly and take Tylenol or ibuprofen as needed for fever control.  They will also help with your generalized body aches.  I recommend ibuprofen 600 mg every 6 hours and Tylenol 500 mg every 6 hours as needed.  Please return to the ED or seek immediate medical attention should you experience any new or worsening symptoms.

## 2021-04-12 NOTE — ED Triage Notes (Signed)
Pt c/o HA, neck pain then states "hurting all over"-also feels he has a fever-NAD-steady gait

## 2021-04-12 NOTE — ED Notes (Signed)
Patient reports waking up with a headache and neck pain this morning, went to work and chills and body aches.  Now reports both have improved, but still slight headache and chills.

## 2021-04-13 ENCOUNTER — Telehealth (HOSPITAL_COMMUNITY): Payer: Self-pay | Admitting: Adult Health

## 2021-04-13 NOTE — Telephone Encounter (Signed)
Called to discuss with patient about COVID-19 symptoms and the use of one of the available treatments for those with mild to moderate Covid symptoms and at a high risk of hospitalization.  Pt appears to qualify for outpatient treatment due to co-morbid conditions and/or a member of an at-risk group in accordance with the FDA Emergency Use Authorization.    Symptom onset: 04/11/2021 Vaccinated: not on file Booster? unknown Immunocompromised? no Qualifiers: 2 NIH Criteria: 2  Unable to reach pt - LMOM   Noreene Filbert

## 2022-06-06 ENCOUNTER — Encounter (HOSPITAL_BASED_OUTPATIENT_CLINIC_OR_DEPARTMENT_OTHER): Payer: Self-pay | Admitting: *Deleted

## 2022-06-06 ENCOUNTER — Emergency Department (HOSPITAL_BASED_OUTPATIENT_CLINIC_OR_DEPARTMENT_OTHER)
Admission: EM | Admit: 2022-06-06 | Discharge: 2022-06-06 | Disposition: A | Discharge status: Home / Self Care | Payer: Medicaid Other | Attending: Emergency Medicine | Admitting: Emergency Medicine

## 2022-06-06 DIAGNOSIS — H6691 Otitis media, unspecified, right ear: Secondary | ICD-10-CM | POA: Insufficient documentation

## 2022-06-06 DIAGNOSIS — H6690 Otitis media, unspecified, unspecified ear: Secondary | ICD-10-CM

## 2022-06-06 DIAGNOSIS — H60501 Unspecified acute noninfective otitis externa, right ear: Secondary | ICD-10-CM | POA: Insufficient documentation

## 2022-06-06 MED ORDER — OFLOXACIN 0.3 % OT SOLN: 5.0000 [drp] | Freq: Two times a day (BID) | OTIC | 0 refills | Status: AC

## 2022-06-06 MED ORDER — AMOXICILLIN 500 MG PO CAPS: 500.0000 mg | ORAL_CAPSULE | Freq: Two times a day (BID) | ORAL | 0 refills | Status: AC

## 2022-06-06 NOTE — Discharge Instructions (Signed)
Please follow-up with ear nose and throat doctor for recheck of your right ear.  Take the antibiotics and the antibiotic drops.  If you are having worsening ear pain, redness, fever or other new concerning symptom, come back to ER for reassessment.

## 2022-06-06 NOTE — ED Provider Notes (Signed)
[facility] EMERGENCY DEPARTMENT Provider Note   CSN: [id number] Arrival date & time: 06/06/22  0731     History  Chief Complaint  Patient presents with   Otalgia    Ernest Lam is a 21 y.o. male.  Presented to ER for ear pain.  Patient states the past 2 weeks he has been having some intermittent ear pain and that for the past week he has been having some drainage from his right ear.  Has noticed slight muffling of the sound in his right ear.  He denies any fevers or chills.  Otherwise has been feeling fine.  He denies any major medical problems.  No known injuries to this year.  HPI     Home Medications Prior to Admission medications   Medication Sig Start Date End Date Taking? Authorizing Provider  amoxicillin (AMOXIL) 500 MG capsule Take 1 capsule (500 mg total) by mouth 2 (two) times daily for 7 days. 06/06/22 06/13/22 Yes Raymund Manrique, [place] Livings, MD  ofloxacin (FLOXIN) 0.3 % OTIC solution Place 5 drops into the right ear 2 (two) times daily for 7 days. 06/06/22 06/13/22 Yes Johnette Teigen, [place] Livings, MD  doxycycline (VIBRAMYCIN) 100 MG capsule Take 1 capsule (100 mg total) by mouth 2 (two) times daily. 05/11/20   Rise Mu, PA-C  famotidine (PEPCID) 20 MG tablet Take 1 tablet (20 mg total) by mouth 2 (two) times daily. 10/05/16   Nelva Nay, MD  ondansetron (ZOFRAN ODT) 4 MG disintegrating tablet Take 1 tablet (4 mg total) by mouth every 8 (eight) hours as needed for nausea or vomiting. 07/20/17   McDonald, Mia A, PA-C  sulfamethoxazole-trimethoprim (BACTRIM DS) 800-160 MG tablet Take 1 tablet by mouth 2 (two) times daily. 05/13/20   Gilda Crease, MD  valACYclovir (VALTREX) 1000 MG tablet Take 1 tablet (1,000 mg total) by mouth 3 (three) times daily. 10/31/20   Jeannie Fend, PA-C      Allergies    Patient has no known allergies.    Review of Systems   Review of Systems  Constitutional:  Negative for chills and fever.  HENT:  Positive for ear pain.  Negative for sore throat.   Eyes:  Negative for pain and visual disturbance.  Respiratory:  Negative for cough and shortness of breath.   Cardiovascular:  Negative for chest pain and palpitations.  Gastrointestinal:  Negative for abdominal pain and vomiting.  Genitourinary:  Negative for dysuria and hematuria.  Musculoskeletal:  Negative for arthralgias and back pain.  Skin:  Negative for color change and rash.  Neurological:  Negative for seizures and syncope.  All other systems reviewed and are negative.   Physical Exam Updated Vital Signs BP 137/68 (BP Location: Right Arm)   Pulse 84   Temp 98.7 F (37.1 C) (Oral)   Resp 18   SpO2 100%  Physical Exam Vitals and nursing note reviewed.  Constitutional:      General: He is not in acute distress.    Appearance: He is well-developed.  HENT:     Head: Normocephalic and atraumatic.     Ears:     Comments: In the right ear, the external canal has some erythema and swelling and small drainage noted, the TM appears intact but does have purulence behind the TM Eyes:     Conjunctiva/sclera: Conjunctivae normal.  Cardiovascular:     Rate and Rhythm: Normal rate and regular rhythm.     Pulses: Normal pulses.  Pulmonary:     Effort:  Pulmonary effort is normal. No respiratory distress.  Abdominal:     Palpations: Abdomen is soft.  Musculoskeletal:        General: No swelling.     Cervical back: Neck supple.  Skin:    General: Skin is warm and dry.     Capillary Refill: Capillary refill takes less than 2 seconds.  Neurological:     General: No focal deficit present.     Mental Status: He is alert.  Psychiatric:        Mood and Affect: Mood normal.     ED Results / Procedures / Treatments   Labs (all labs ordered are listed, but only abnormal results are displayed) Labs Reviewed - No data to display  EKG None  Radiology No results found.  Procedures Procedures    Medications Ordered in ED Medications - No data to  display  ED Course/ Medical Decision Making/ A&P                           Medical Decision Making Risk Prescription drug management.   21 year old presents for right ear pain.  On exam he does have some erythema, swelling, drainage in the external canal and there is some purulence noted behind TM.  Concern for both otitis media and externa.  Gave Rx for oral antibiotics and topical antibiotics.  Provided info for ENT if symptoms not completely resolving with these interventions.  Reviewed return precautions and discharged.  After the discussed management above, the patient was determined to be safe for discharge.  The patient was in agreement with this plan and all questions regarding their care were answered.  ED return precautions were discussed and the patient will return to the ED with any significant worsening of condition.         Final Clinical Impression(s) / ED Diagnoses Final diagnoses:  Acute otitis externa of right ear, unspecified type  Acute otitis media, unspecified otitis media type    Rx / DC Orders ED Discharge Orders          Ordered    amoxicillin (AMOXIL) 500 MG capsule  2 times daily        06/06/22 0805    ofloxacin (FLOXIN) 0.3 % OTIC solution  2 times daily        06/06/22 0805              Milagros Loll, MD 06/06/22 [phone]

## 2022-06-06 NOTE — ED Triage Notes (Signed)
States he has rt ear ache for the past week, now has some drainage from rt ear. Denies any gait issues or dizziness.

## 2023-06-08 ENCOUNTER — Emergency Department (HOSPITAL_BASED_OUTPATIENT_CLINIC_OR_DEPARTMENT_OTHER)
Admission: EM | Admit: 2023-06-08 | Discharge: 2023-06-08 | Disposition: A | Discharge status: Home / Self Care | Payer: Medicaid Other | Attending: Emergency Medicine | Admitting: Emergency Medicine

## 2023-06-08 ENCOUNTER — Encounter (HOSPITAL_BASED_OUTPATIENT_CLINIC_OR_DEPARTMENT_OTHER): Payer: Self-pay

## 2023-06-08 ENCOUNTER — Other Ambulatory Visit: Payer: Self-pay

## 2023-06-08 DIAGNOSIS — H9211 Otorrhea, right ear: Secondary | ICD-10-CM | POA: Insufficient documentation

## 2023-06-08 DIAGNOSIS — H6691 Otitis media, unspecified, right ear: Secondary | ICD-10-CM

## 2023-06-08 MED ORDER — CIPROFLOXACIN-DEXAMETHASONE 0.3-0.1 % OT SUSP
4.0000 [drp] | Freq: Once | OTIC | Status: AC
  Administered 2023-06-08: 4 [drp] via OTIC
  Filled 2023-06-08: qty 7.5

## 2023-06-08 MED ORDER — AMOXICILLIN-POT CLAVULANATE 875-125 MG PO TABS: 1.0000 | ORAL_TABLET | Freq: Two times a day (BID) | ORAL | 0 refills | Status: AC

## 2023-06-08 MED ORDER — AMOXICILLIN-POT CLAVULANATE 875-125 MG PO TABS
1.0000 | ORAL_TABLET | Freq: Once | ORAL | Status: AC
  Administered 2023-06-08: 1 via ORAL
  Filled 2023-06-08: qty 1

## 2023-06-08 MED ORDER — CIPROFLOXACIN-DEXAMETHASONE 0.3-0.1 % OT SUSP: 4.0000 [drp] | Freq: Once | OTIC | Status: DC

## 2023-06-08 NOTE — Discharge Instructions (Addendum)
1.  We will be prescribing Augmentin and Ciprodex drops (use 4 drops at a time in the right ear twice a day) and we will give to you.  If you are unable to get these medications you need to immediately return for further care. 2.  You will need to call your primary care provider for long-term management.  I have attached the contact information for otolaryngology (ENT).  Please follow-up with a local specialist if you are unable to I have also attached to [facility] subspecialty line to obtain an appointment with them.  If you cannot get a specialist appointment I would recommend proceeding to the [facility] emergency room for consultation in person. 3.  Do not put anything in your ear especially Q-tips.  Allow it to drain naturally.  You may put a gauze pad over your ear for it to drain overnight do not put earplugs in.  Cover the entire ear when bathing.

## 2023-06-08 NOTE — ED Triage Notes (Signed)
Pt reports being diagnosed with ear infection this time last year. Pt reports only purchasing one of the 2 antibiotics that were prescribed. Pt reports drainage from right ear since. Patient noticed blood coming from ear today when he woke up. Pt reports decreased hearing in right ear for past year. Intermittent pain/ringing in ears beginning today. Pt denies fevers.

## 2023-06-08 NOTE — ED Notes (Addendum)
Advised he could not afford both antbx, only was able to take one over a year ago. Been on and off infection with pus draining daily. Woke up and noticed blood today. Been cleaning it with Qtips, no sharp tweezers use. No active hemorrhage.   Pt hassmoth jellybean-shaped growth coming from external ear canal. Wet blood behind the structure, no dripping, but no hearing in affected ear.

## 2023-06-08 NOTE — ED Provider Notes (Signed)
[place] [facility] Provider Note   CSN: [id number] Arrival date & time: 06/08/23  1626     History  Chief Complaint  Patient presents with   Ear Drainage    Ernest Lam is a 22 y.o. male who presents to the ED with a cc of bleeding ear. He had an ear infection here 1 year ago was treated for otitis externa and otitis media with amoxicillin and ofloxacin.  He states that he could not afford one of the drugs only took the other 1 but continued to have ear drainage for the past year.  He states that he has been putting things in his ear to clean it.  He has had pus draining from his ear.  He states that this morning woke up with blood all over his pillow.  He came in for further evaluation.  He denies significant change in hearing.   Ear Drainage       Home Medications Prior to Admission medications   Medication Sig Start Date End Date Taking? Authorizing Provider  doxycycline (VIBRAMYCIN) 100 MG capsule Take 1 capsule (100 mg total) by mouth 2 (two) times daily. 05/11/20   Rise Mu, PA-C  famotidine (PEPCID) 20 MG tablet Take 1 tablet (20 mg total) by mouth 2 (two) times daily. 10/05/16   Nelva Nay, MD  ondansetron (ZOFRAN ODT) 4 MG disintegrating tablet Take 1 tablet (4 mg total) by mouth every 8 (eight) hours as needed for nausea or vomiting. 07/20/17   McDonald, Mia A, PA-C  sulfamethoxazole-trimethoprim (BACTRIM DS) 800-160 MG tablet Take 1 tablet by mouth 2 (two) times daily. 05/13/20   Gilda Crease, MD  valACYclovir (VALTREX) 1000 MG tablet Take 1 tablet (1,000 mg total) by mouth 3 (three) times daily. 10/31/20   Jeannie Fend, PA-C      Allergies    Patient has no known allergies.    Review of Systems   Review of Systems  Physical Exam Updated Vital Signs BP 132/74 (BP Location: Left Arm)   Pulse (!) 58   Temp (!) 97.4 F (36.3 C) (Temporal)   Resp 15   Ht 6\' 3"  (1.905 m)   Wt 127 kg   SpO2 100%    BMI 35.00 kg/m  Physical Exam Vitals and nursing note reviewed.  Constitutional:      General: He is not in acute distress.    Appearance: He is well-developed. He is not diaphoretic.  HENT:     Head: Normocephalic and atraumatic.     Left Ear: Hearing, tympanic membrane, ear canal and external ear normal.     Ears:     Comments: Right ear with bleeding in the canal.  There appears to be a mass or significant swelling or cystic structure in the canal from the anterior side of the the posterior side behind this structure there is either the edge of the perforated tympanic membrane or overt ossicles.  No pain with movement of the pinna Eyes:     General: No scleral icterus.    Conjunctiva/sclera: Conjunctivae normal.  Cardiovascular:     Rate and Rhythm: Normal rate and regular rhythm.     Heart sounds: Normal heart sounds.  Pulmonary:     Effort: Pulmonary effort is normal. No respiratory distress.     Breath sounds: Normal breath sounds.  Abdominal:     Palpations: Abdomen is soft.     Tenderness: There is no abdominal tenderness.  Musculoskeletal:  Cervical back: Normal range of motion and neck supple.  Skin:    General: Skin is warm and dry.  Neurological:     Mental Status: He is alert.  Psychiatric:        Behavior: Behavior normal.     ED Results / Procedures / Treatments   Labs (all labs ordered are listed, but only abnormal results are displayed) Labs Reviewed - No data to display  EKG None  Radiology No results found.  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  Will treat with Ciprodex and Augmentin  here.  He is instructed to follow closely with ENT and given extensive instructions for outpatient follow-up and return precautions.  He does not have evidence of mastoiditis or meningitis but has an complicated ear infection that needs to be seen by specialist.        Final Clinical Impression(s) / ED Diagnoses Final diagnoses:  None    Rx / DC Orders ED Discharge Orders     None         Arthor Captain, PA-C 06/08/23 1817    Glyn Ade, MD 06/10/23 1254

## 2023-06-09 ENCOUNTER — Other Ambulatory Visit (HOSPITAL_BASED_OUTPATIENT_CLINIC_OR_DEPARTMENT_OTHER): Payer: Self-pay

## 2023-06-09 ENCOUNTER — Telehealth (HOSPITAL_BASED_OUTPATIENT_CLINIC_OR_DEPARTMENT_OTHER): Payer: Self-pay | Admitting: Emergency Medicine

## 2023-06-09 MED ORDER — CIPROFLOXACIN-DEXAMETHASONE 0.3-0.1 % OT SUSP
4.0000 [drp] | Freq: Two times a day (BID) | OTIC | 0 refills | Status: DC
  Filled 2023-06-09: qty 7.5, 19d supply, fill #0

## 2023-06-09 NOTE — Telephone Encounter (Signed)
Patient diagnosed with ruptured TM and prescribed Augmentin with being told he was going to be given antibiotic drops for ruptured TM but were never sent to the pharmacy.  Will send in Ciprodex.

## 2023-06-10 MED ORDER — CIPROFLOXACIN-DEXAMETHASONE 0.3-0.1 % OT SUSP
4.0000 [drp] | Freq: Two times a day (BID) | OTIC | 0 refills | Status: AC
  Filled 2023-06-12: qty 7.5, 19d supply, fill #0

## 2023-06-12 ENCOUNTER — Other Ambulatory Visit (HOSPITAL_BASED_OUTPATIENT_CLINIC_OR_DEPARTMENT_OTHER): Payer: Self-pay
